# Patient Record
Sex: Male | Born: 1960 | Race: White | Hispanic: No | Marital: Single
Health system: Southern US, Community
[De-identification: ages and names within clinical notes are randomized; demographics above are authoritative.]

---

## 2013-12-04 ENCOUNTER — Emergency Department: Payer: Self-pay | Admitting: Emergency Medicine

## 2013-12-04 LAB — CBC WITH DIFFERENTIAL/PLATELET
BASOS PCT: 0.2 %
Basophil #: 0 10*3/uL (ref 0.0–0.1)
EOS PCT: 0.4 %
Eosinophil #: 0.1 10*3/uL (ref 0.0–0.7)
HCT: 41.3 % (ref 40.0–52.0)
HGB: 13.8 g/dL (ref 13.0–18.0)
LYMPHS ABS: 2.5 10*3/uL (ref 1.0–3.6)
Lymphocyte %: 14.9 %
MCH: 31.5 pg (ref 26.0–34.0)
MCHC: 33.4 g/dL (ref 32.0–36.0)
MCV: 94 fL (ref 80–100)
MONO ABS: 1.4 x10 3/mm — AB (ref 0.2–1.0)
Monocyte %: 8.4 %
NEUTROS ABS: 12.5 10*3/uL — AB (ref 1.4–6.5)
Neutrophil %: 76.1 %
Platelet: 143 10*3/uL — ABNORMAL LOW (ref 150–440)
RBC: 4.39 10*6/uL — ABNORMAL LOW (ref 4.40–5.90)
RDW: 14.4 % (ref 11.5–14.5)
WBC: 16.5 10*3/uL — AB (ref 3.8–10.6)

## 2013-12-04 LAB — COMPREHENSIVE METABOLIC PANEL
ALBUMIN: 3.5 g/dL (ref 3.4–5.0)
ALK PHOS: 87 U/L
Anion Gap: 9 (ref 7–16)
BUN: 12 mg/dL (ref 7–18)
Bilirubin,Total: 0.4 mg/dL (ref 0.2–1.0)
CALCIUM: 8.1 mg/dL — AB (ref 8.5–10.1)
Chloride: 107 mmol/L (ref 98–107)
Co2: 28 mmol/L (ref 21–32)
Creatinine: 1.03 mg/dL (ref 0.60–1.30)
EGFR (African American): >60
Glucose: 107 mg/dL — ABNORMAL HIGH (ref 65–99)
OSMOLALITY: 287 (ref 275–301)
Potassium: 4 mmol/L (ref 3.5–5.1)
SGOT(AST): 24 U/L (ref 15–37)
SGPT (ALT): 24 U/L
Sodium: 144 mmol/L (ref 136–145)
Total Protein: 7.3 g/dL (ref 6.4–8.2)

## 2013-12-04 LAB — LIPASE, BLOOD: Lipase: 69 U/L — ABNORMAL LOW (ref 73–393)

## 2013-12-04 LAB — ETHANOL: ETHANOL LVL: 210 mg/dL

## 2013-12-05 LAB — ETHANOL: Ethanol: <3 mg/dL

## 2013-12-05 LAB — URINALYSIS, COMPLETE
BACTERIA: NONE SEEN
BLOOD: NEGATIVE
Bilirubin,UR: NEGATIVE
Glucose,UR: NEGATIVE mg/dL (ref 0–75)
KETONE: NEGATIVE
LEUKOCYTE ESTERASE: NEGATIVE
Nitrite: NEGATIVE
Ph: 7 (ref 4.5–8.0)
Protein: NEGATIVE
SPECIFIC GRAVITY: 1.015 (ref 1.003–1.030)
Squamous Epithelial: NONE SEEN
WBC UR: <1 /HPF (ref 0–5)

## 2013-12-13 ENCOUNTER — Inpatient Hospital Stay: Payer: Self-pay | Admitting: Psychiatry

## 2013-12-13 LAB — CBC
HCT: 41.3 % (ref 40.0–52.0)
HGB: 14 g/dL (ref 13.0–18.0)
MCH: 31.9 pg (ref 26.0–34.0)
MCHC: 33.9 g/dL (ref 32.0–36.0)
MCV: 94 fL (ref 80–100)
Platelet: 258 10*3/uL (ref 150–440)
RBC: 4.4 10*6/uL (ref 4.40–5.90)
RDW: 14.5 % (ref 11.5–14.5)
WBC: 6.3 10*3/uL (ref 3.8–10.6)

## 2013-12-13 LAB — COMPREHENSIVE METABOLIC PANEL
ALT: 30 U/L
AST: 37 U/L (ref 15–37)
Albumin: 3.6 g/dL (ref 3.4–5.0)
Alkaline Phosphatase: 81 U/L
Anion Gap: 9 (ref 7–16)
BUN: 18 mg/dL (ref 7–18)
Bilirubin,Total: 0.3 mg/dL (ref 0.2–1.0)
CO2: 30 mmol/L (ref 21–32)
Calcium, Total: 8 mg/dL — ABNORMAL LOW (ref 8.5–10.1)
Chloride: 103 mmol/L (ref 98–107)
Creatinine: 1.2 mg/dL (ref 0.60–1.30)
EGFR (Non-African Amer.): >60
GLUCOSE: 126 mg/dL — AB (ref 65–99)
Osmolality: 287 (ref 275–301)
Potassium: 3.5 mmol/L (ref 3.5–5.1)
Sodium: 142 mmol/L (ref 136–145)
TOTAL PROTEIN: 7.2 g/dL (ref 6.4–8.2)

## 2013-12-13 LAB — ETHANOL
ETHANOL LVL: 308 mg/dL — AB
ETHANOL LVL: 191 mg/dL

## 2013-12-13 LAB — LIPASE, BLOOD: LIPASE: 89 U/L (ref 73–393)

## 2013-12-15 LAB — DRUG SCREEN, URINE
Amphetamines, Ur Screen: NEGATIVE (ref ?–1000)
Barbiturates, Ur Screen: NEGATIVE (ref ?–200)
Benzodiazepine, Ur Scrn: POSITIVE (ref ?–200)
Cannabinoid 50 Ng, Ur ~~LOC~~: NEGATIVE (ref ?–50)
Cocaine Metabolite,Ur ~~LOC~~: NEGATIVE (ref ?–300)
MDMA (ECSTASY) UR SCREEN: NEGATIVE (ref ?–500)
METHADONE, UR SCREEN: NEGATIVE (ref ?–300)
Opiate, Ur Screen: NEGATIVE (ref ?–300)
Phencyclidine (PCP) Ur S: NEGATIVE (ref ?–25)
Tricyclic, Ur Screen: NEGATIVE (ref ?–1000)

## 2013-12-15 LAB — URINALYSIS, COMPLETE
Bacteria: NONE SEEN
Bilirubin,UR: NEGATIVE
Blood: NEGATIVE
GLUCOSE, UR: NEGATIVE mg/dL (ref 0–75)
Ketone: NEGATIVE
Leukocyte Esterase: NEGATIVE
Nitrite: NEGATIVE
Ph: 6 (ref 4.5–8.0)
Protein: NEGATIVE
RBC,UR: <1 /HPF (ref 0–5)
Specific Gravity: 1.02 (ref 1.003–1.030)
Squamous Epithelial: NONE SEEN
WBC UR: 1 /HPF (ref 0–5)

## 2013-12-21 ENCOUNTER — Emergency Department: Payer: Self-pay | Admitting: Emergency Medicine

## 2013-12-21 LAB — CBC WITH DIFFERENTIAL/PLATELET
Basophil #: 0.1 10*3/uL (ref 0.0–0.1)
Basophil %: 0.6 %
EOS ABS: 0 10*3/uL (ref 0.0–0.7)
Eosinophil %: 0.3 %
HCT: 51.1 % (ref 40.0–52.0)
HGB: 17.5 g/dL (ref 13.0–18.0)
Lymphocyte #: 2.8 10*3/uL (ref 1.0–3.6)
Lymphocyte %: 34.4 %
MCH: 31.9 pg (ref 26.0–34.0)
MCHC: 34.3 g/dL (ref 32.0–36.0)
MCV: 93 fL (ref 80–100)
MONO ABS: 0.9 x10 3/mm (ref 0.2–1.0)
Monocyte %: 11.3 %
Neutrophil #: 4.4 10*3/uL (ref 1.4–6.5)
Neutrophil %: 53.4 %
Platelet: 274 10*3/uL (ref 150–440)
RBC: 5.5 10*6/uL (ref 4.40–5.90)
RDW: 14.7 % — AB (ref 11.5–14.5)
WBC: 8.2 10*3/uL (ref 3.8–10.6)

## 2013-12-21 LAB — COMPREHENSIVE METABOLIC PANEL
Albumin: 4.1 g/dL (ref 3.4–5.0)
Alkaline Phosphatase: 106 U/L
Anion Gap: 13 (ref 7–16)
BILIRUBIN TOTAL: 1.5 mg/dL — AB (ref 0.2–1.0)
BUN: 22 mg/dL — ABNORMAL HIGH (ref 7–18)
CO2: 23 mmol/L (ref 21–32)
CREATININE: 1.2 mg/dL (ref 0.60–1.30)
Calcium, Total: 8.6 mg/dL (ref 8.5–10.1)
Chloride: 101 mmol/L (ref 98–107)
Glucose: 158 mg/dL — ABNORMAL HIGH (ref 65–99)
OSMOLALITY: 280 (ref 275–301)
Potassium: 3.8 mmol/L (ref 3.5–5.1)
SGOT(AST): 244 U/L — ABNORMAL HIGH (ref 15–37)
SGPT (ALT): 185 U/L — ABNORMAL HIGH
SODIUM: 137 mmol/L (ref 136–145)
TOTAL PROTEIN: 8 g/dL (ref 6.4–8.2)

## 2013-12-21 LAB — URINALYSIS, COMPLETE
Bilirubin,UR: NEGATIVE
Blood: NEGATIVE
GLUCOSE, UR: NEGATIVE mg/dL (ref 0–75)
Leukocyte Esterase: NEGATIVE
Nitrite: NEGATIVE
Ph: 5 (ref 4.5–8.0)
Protein: 30
RBC,UR: 1 /HPF (ref 0–5)
SQUAMOUS EPITHELIAL: NONE SEEN
Specific Gravity: 1.029 (ref 1.003–1.030)
WBC UR: 1 /HPF (ref 0–5)

## 2013-12-21 LAB — LIPASE, BLOOD: Lipase: 778 U/L — ABNORMAL HIGH (ref 73–393)

## 2013-12-21 LAB — ETHANOL: Ethanol: 218 mg/dL

## 2013-12-22 ENCOUNTER — Inpatient Hospital Stay: Payer: Self-pay | Admitting: Internal Medicine

## 2013-12-22 LAB — CBC
HCT: 46.6 % (ref 40.0–52.0)
HGB: 15.4 g/dL (ref 13.0–18.0)
MCH: 31.2 pg (ref 26.0–34.0)
MCHC: 33.1 g/dL (ref 32.0–36.0)
MCV: 94 fL (ref 80–100)
PLATELETS: 249 10*3/uL (ref 150–440)
RBC: 4.94 10*6/uL (ref 4.40–5.90)
RDW: 14.3 % (ref 11.5–14.5)
WBC: 11.2 10*3/uL — AB (ref 3.8–10.6)

## 2013-12-22 LAB — COMPREHENSIVE METABOLIC PANEL
ALK PHOS: 117 U/L — AB
ALT: 177 U/L — AB
ANION GAP: 15 (ref 7–16)
Albumin: 3.8 g/dL (ref 3.4–5.0)
BUN: 21 mg/dL — AB (ref 7–18)
Bilirubin,Total: 3.7 mg/dL — ABNORMAL HIGH (ref 0.2–1.0)
Calcium, Total: 8.3 mg/dL — ABNORMAL LOW (ref 8.5–10.1)
Chloride: 98 mmol/L (ref 98–107)
Co2: 22 mmol/L (ref 21–32)
Creatinine: 1.25 mg/dL (ref 0.60–1.30)
EGFR (African American): >60
EGFR (Non-African Amer.): >60
GLUCOSE: 128 mg/dL — AB (ref 65–99)
Osmolality: 275 (ref 275–301)
Potassium: 4.2 mmol/L (ref 3.5–5.1)
SGOT(AST): 207 U/L — ABNORMAL HIGH (ref 15–37)
SODIUM: 135 mmol/L — AB (ref 136–145)
Total Protein: 7.2 g/dL (ref 6.4–8.2)

## 2013-12-22 LAB — PROTIME-INR
INR: 1
Prothrombin Time: 13.4 secs (ref 11.5–14.7)

## 2013-12-22 LAB — ETHANOL: ETHANOL LVL: 4 mg/dL

## 2013-12-22 LAB — LIPASE, BLOOD: Lipase: 3019 U/L — ABNORMAL HIGH (ref 73–393)

## 2013-12-23 LAB — COMPREHENSIVE METABOLIC PANEL
ALK PHOS: 93 U/L
ALT: 118 U/L — AB
Albumin: 2.7 g/dL — ABNORMAL LOW (ref 3.4–5.0)
Anion Gap: 9 (ref 7–16)
BILIRUBIN TOTAL: 3.7 mg/dL — AB (ref 0.2–1.0)
BUN: 14 mg/dL (ref 7–18)
Calcium, Total: 7.5 mg/dL — ABNORMAL LOW (ref 8.5–10.1)
Chloride: 104 mmol/L (ref 98–107)
Co2: 24 mmol/L (ref 21–32)
Creatinine: 1.16 mg/dL (ref 0.60–1.30)
EGFR (Non-African Amer.): >60
GLUCOSE: 109 mg/dL — AB (ref 65–99)
Osmolality: 275 (ref 275–301)
POTASSIUM: 3.5 mmol/L (ref 3.5–5.1)
SGOT(AST): 119 U/L — ABNORMAL HIGH (ref 15–37)
Sodium: 137 mmol/L (ref 136–145)
TOTAL PROTEIN: 5.7 g/dL — AB (ref 6.4–8.2)

## 2013-12-23 LAB — CBC WITH DIFFERENTIAL/PLATELET
BASOS PCT: 0.5 %
Basophil #: 0 10*3/uL (ref 0.0–0.1)
Eosinophil #: 0 10*3/uL (ref 0.0–0.7)
Eosinophil %: 0.6 %
HCT: 40.8 % (ref 40.0–52.0)
HGB: 13.8 g/dL (ref 13.0–18.0)
Lymphocyte #: 1.8 10*3/uL (ref 1.0–3.6)
Lymphocyte %: 22.8 %
MCH: 32.1 pg (ref 26.0–34.0)
MCHC: 33.8 g/dL (ref 32.0–36.0)
MCV: 95 fL (ref 80–100)
Monocyte #: 0.9 x10 3/mm (ref 0.2–1.0)
Monocyte %: 11 %
Neutrophil #: 5.1 10*3/uL (ref 1.4–6.5)
Neutrophil %: 65.1 %
PLATELETS: 117 10*3/uL — AB (ref 150–440)
RBC: 4.31 10*6/uL — ABNORMAL LOW (ref 4.40–5.90)
RDW: 14.3 % (ref 11.5–14.5)
WBC: 7.8 10*3/uL (ref 3.8–10.6)

## 2013-12-23 LAB — URINALYSIS, COMPLETE
BACTERIA: NONE SEEN
BILIRUBIN, UR: NEGATIVE
GLUCOSE, UR: NEGATIVE mg/dL (ref 0–75)
Leukocyte Esterase: NEGATIVE
Nitrite: NEGATIVE
Ph: 6 (ref 4.5–8.0)
Protein: NEGATIVE
RBC,UR: 1 /HPF (ref 0–5)
SPECIFIC GRAVITY: 1.025 (ref 1.003–1.030)
Squamous Epithelial: NONE SEEN
WBC UR: 1 /HPF (ref 0–5)

## 2013-12-23 LAB — LIPASE, BLOOD: LIPASE: 1486 U/L — AB (ref 73–393)

## 2013-12-24 LAB — COMPREHENSIVE METABOLIC PANEL
ALT: 82 U/L — AB
Albumin: 2.5 g/dL — ABNORMAL LOW (ref 3.4–5.0)
Alkaline Phosphatase: 92 U/L
Anion Gap: 7 (ref 7–16)
BUN: 6 mg/dL — AB (ref 7–18)
Bilirubin,Total: 2.3 mg/dL — ABNORMAL HIGH (ref 0.2–1.0)
CREATININE: 0.91 mg/dL (ref 0.60–1.30)
Calcium, Total: 7.3 mg/dL — ABNORMAL LOW (ref 8.5–10.1)
Chloride: 102 mmol/L (ref 98–107)
Co2: 25 mmol/L (ref 21–32)
EGFR (African American): >60
Glucose: 81 mg/dL (ref 65–99)
OSMOLALITY: 265 (ref 275–301)
Potassium: 3.3 mmol/L — ABNORMAL LOW (ref 3.5–5.1)
SGOT(AST): 76 U/L — ABNORMAL HIGH (ref 15–37)
Sodium: 134 mmol/L — ABNORMAL LOW (ref 136–145)
Total Protein: 5.7 g/dL — ABNORMAL LOW (ref 6.4–8.2)

## 2013-12-24 LAB — CBC WITH DIFFERENTIAL/PLATELET
BASOS PCT: 0.7 %
Basophil #: 0.1 10*3/uL (ref 0.0–0.1)
EOS PCT: 1.5 %
Eosinophil #: 0.1 10*3/uL (ref 0.0–0.7)
HCT: 36.9 % — ABNORMAL LOW (ref 40.0–52.0)
HGB: 12.5 g/dL — ABNORMAL LOW (ref 13.0–18.0)
LYMPHS ABS: 1.4 10*3/uL (ref 1.0–3.6)
Lymphocyte %: 18.9 %
MCH: 31.8 pg (ref 26.0–34.0)
MCHC: 34 g/dL (ref 32.0–36.0)
MCV: 94 fL (ref 80–100)
MONOS PCT: 9.2 %
Monocyte #: 0.7 x10 3/mm (ref 0.2–1.0)
NEUTROS ABS: 5.3 10*3/uL (ref 1.4–6.5)
Neutrophil %: 69.7 %
Platelet: 75 10*3/uL — ABNORMAL LOW (ref 150–440)
RBC: 3.94 10*6/uL — AB (ref 4.40–5.90)
RDW: 14.1 % (ref 11.5–14.5)
WBC: 7.6 10*3/uL (ref 3.8–10.6)

## 2013-12-24 LAB — LIPASE, BLOOD: LIPASE: 442 U/L — AB (ref 73–393)

## 2013-12-25 LAB — COMPREHENSIVE METABOLIC PANEL
ALBUMIN: 2.5 g/dL — AB (ref 3.4–5.0)
ALK PHOS: 79 U/L
ALT: 60 U/L
ANION GAP: 6 — AB (ref 7–16)
BILIRUBIN TOTAL: 1.7 mg/dL — AB (ref 0.2–1.0)
BUN: 5 mg/dL — ABNORMAL LOW (ref 7–18)
CALCIUM: 7.6 mg/dL — AB (ref 8.5–10.1)
CHLORIDE: 103 mmol/L (ref 98–107)
Co2: 24 mmol/L (ref 21–32)
Creatinine: 0.92 mg/dL (ref 0.60–1.30)
EGFR (Non-African Amer.): >60
Glucose: 93 mg/dL (ref 65–99)
Osmolality: 263 (ref 275–301)
Potassium: 3.2 mmol/L — ABNORMAL LOW (ref 3.5–5.1)
SGOT(AST): 45 U/L — ABNORMAL HIGH (ref 15–37)
Sodium: 133 mmol/L — ABNORMAL LOW (ref 136–145)
TOTAL PROTEIN: 5.8 g/dL — AB (ref 6.4–8.2)

## 2013-12-25 LAB — LIPASE, BLOOD: LIPASE: 333 U/L (ref 73–393)

## 2014-05-06 NOTE — Consult Note (Signed)
PATIENT NAME:  Connor Rice, Connor Rice MR#:  [id number] DATE OF BIRTH:  04/11/1960  DATE OF CONSULTATION:  12/23/2013  REFERRING PHYSICIAN:   CONSULTING PHYSICIAN:  Audery AmelJohn T. Jahzion Brogden, MD  IDENTIFYING INFORMATION AND REASON FOR CONSULT: This is a 54 year old man with a history of alcohol abuse and depression in the past who is currently in the hospital with probable pancreatitis. Consultation is for depression.   HISTORY OF PRESENT ILLNESS: Information obtained from the patient and the chart. The patient was in the hospital recently and was discharged on December 4. He returned to the hospital just about 6 days later with acute, severe abdominal pain. The patient reports that after discharge from the hospital, he pretty much immediately went back to drinking about 1/2 gallon of liquor a day. Currently he states that his mood is feeling down and depressed. He is not sleeping well at night. He says he is having crying spells, not infrequently. Denies any current suicidal ideation. He is having visual hallucinations. Says he sees people walking around in his room and that the ceiling of his room seems to be waving strangely. Denies auditory hallucinations. Does not appear to be having other acute psychosis. He is not feeling hopeless about his situation.   PAST PSYCHIATRIC HISTORY: The patient does have a past history of depression and a past history of a serious suicide attempt, but it was decades ago in 1981. He has been on several antidepressants in the past including Celexa and Prozac, but has rarely stayed compliant with them. No known history of bipolar disorder. History of long-standing alcohol abuse with multiple physical problems as a result.   PAST MEDICAL HISTORY: Has a history of pancreatitis and diverticulitis, irritable bowel syndrome, consequences of alcohol abuse.   SOCIAL HISTORY: The patient is no longer employed and he is living off his retirement income. He and a woman he identifies as his  girlfriend, who is also an alcoholic, are living in motels pretty much hand-to-mouth and drinking heavily. Estranged from most of his family.   FAMILY HISTORY: Reports that his father and brother had severe mental health problems.   REVIEW OF SYSTEMS: Currently says he is having some visual hallucinations. Mood is depressed. Pain in his abdomen. Not able to take anything by mouth right now. Not having acute suicidal ideation.   MENTAL STATUS EXAMINATION: Disheveled, acutely ill gentleman looks his stated age, cooperative with the interview. Eye contact decreased. He mostly stares intently at the ceiling. Speech decreased in amount and quiet. Affect is flat. Mood is stated as bad. Thoughts are decreased in amount, slow and interrupted, but not bizarre or delusional. Mostly lucid. Positive visual hallucinations. No auditory hallucinations. No acute suicidal or homicidal ideation. Can recall 3/3 objects immediately, only 1/3 at 3 minutes. He is alert and oriented, but got the date wrong.   LABORATORY RESULTS: Lipase is still running up around close to 1500. Has abnormalities of his AST and ALT as well as his protein and albumin. Low platelet count 117,000.   VITAL SIGNS: Blood pressure 153/80, respirations 18, pulse 87, temperature 99.2.   CURRENT MEDICATIONS: Pepcid 20 mg IV, heparin injection, Ativan IV p.r.n. by protocol, morphine IV by protocol, Zofran IV p.r.n.   ASSESSMENT: A 54 year old man with alcohol abuse and a past history of depression, currently major problem is alcohol withdrawal. He is having visual hallucinations, but he is not presenting otherwise as being delirious and his insight about them is good. I think this is probably not delirium tremens,  so much as it could be side effects of the morphine that he is getting. He tells me that morphine has caused visual hallucinations in the past. Nevertheless, he is still having significant alcohol withdrawal and needs to stay on the  detoxification taper. The patient has depressive symptoms, but also is in pain and has been drinking. Difficult to assess the appropriateness of antidepressant treatment under these circumstances and antidepressants could not be administered anyway due to his being n.p.o.   TREATMENT PLAN: I am going give him a 1-time dose tonight of Valium 10 mg IV to help him sleep. No indication to start antidepressants right now. Continue detox protocol as ordered. If withdrawal symptoms get much worse or delirium presents, would rapidly escalate the amount of Ativan that he is getting. I will follow up tomorrow and we can reassess depression as he heals up.   DIAGNOSIS, PRINCIPAL AND PRIMARY:  AXIS I: Alcohol withdrawal.   SECONDARY DIAGNOSES: AXIS I: 1.  Depression, not otherwise specified. 2.  Alcohol abuse.  AXIS II: Deferred.  AXIS III: Pancreatitis.   ____________________________ Audery Amel, MD jtc:sw D: 12/23/2013 18:32:24 ET T: 12/23/2013 18:42:24 ET JOB#: [id number]  cc: Audery Amel, MD, <Dictator> Audery Amel MD ELECTRONICALLY SIGNED 12/25/2013 11:24

## 2014-05-06 NOTE — Consult Note (Signed)
PATIENT NAME:  Connor Rice, Connor Rice MR#:  [id number] DATE OF BIRTH:  1960-11-30  DATE OF CONSULTATION:  12/13/2013  CONSULTING PHYSICIAN:  Audery AmelJohn T. Tristyn Pharris, MD  IDENTIFYING INFORMATION AND REASON FOR CONSULT:  A 54 year old man with a history of alcohol abuse and depression presents to the hospital.   CHIEF COMPLAINT: "I'm depressed."   HISTORY OF PRESENT ILLNESS: Information obtained from the patient and the chart. The patient came voluntarily to the hospital stating that he was feeling depressed. He says that his mood is down.  Feels hopeless.  Feels like his life is over. Energy level low.  On top of that, he is drinking heavily.  He presented with a blood alcohol level of 308 this morning.  He just got out of [facility] a couple of days ago and immediately started drinking again.  Estimates his drinking as being nearly 1/2 gallon a day at times.  He is not currently taking any psychiatric medicine.  He says he has been depressed at least since January when he lost his last job. He has been in and out of psychiatric hospitals in detoxes ever since but has not been able to maintain sobriety for any length of time.  He is traveling around with his girlfriend who also is drinking heavily.   PAST PSYCHIATRIC HISTORY: Says that he tried to kill himself in [id number].  Currently he has passive suicidal thoughts with no intention or plan.  He has had multiple psychiatric hospitalizations for detoxification including at [facility].  He has been to our ER before as well. He has been prescribed antidepressants including Celexa and Prozac, but has not remained compliant with them; does not know whether they would work.   PAST MEDICAL HISTORY: Says that he has a history of pancreatitis, diverticulitis, irritable bowel syndrome and gastritis. Not on any current medicines.   ALLERGIES: LATEX.   SOCIAL HISTORY: Not employed.  Living off his retirement savings.  He and his girlfriend are both alcoholics and they have been traveling  around staying in motels because no one will let them rent a place from them.  Estranged from most of the rest of his family. He feels like he has no resources.   FAMILY HISTORY: Father and brother both had mental health problems by his report.   REVIEW OF SYSTEMS:  Depressed mood, poor sleep, hopelessness, passive suicidal thoughts. No hallucinations.   MENTAL STATUS EXAMINATION: A somewhat disheveled gentleman who looks his stated age. Passively cooperative with the interview. Eye contact poor. Psychomotor activity very slow and sluggish.  Speech is slow with thought blocking.  Affect flat and dysphoric.  Mood is stated as depressed. Thoughts are slightly disorganized, but not bizarre.  Denies any current hallucinations. Denies suicidal or homicidal intent or plan although he says he has had passive wishes that he would die.  He can recall 2/3 object at 3 minutes. He is alert and oriented to his place and to the year, does not know the correct date or month. Baseline intelligence presumably normal.  Fund of knowledge normal.   LABORATORY RESULTS: Alcohol level this morning was 308. Glucose 126, calcium low at 8.0, lipase 89. CBC normal.   VITAL SIGNS: His blood pressure is 125/98, respirations 17, pulse 65, temperature 97.5.   ASSESSMENT: This is a 54 year old man with alcohol dependence and depression, possibly major depression versus related to his alcohol abuse, who gives a history of having had seizures in the past possibly DTs. Says he is still depressed with passive  suicidal ideation and a feeling of hopelessness. The patient needs hospitalization, at least for safe detoxification and stabilization.   TREATMENT PLAN: Admit to psychiatry. Full detox protocol in place. Seizure and fall precautions in place.  Defer any decisions about antidepressants to primary psychiatric team. I will give him some more Librium right now to help make sure that he is going to detox safely.   DIAGNOSIS, PRINCIPAL  AND PRIMARY:  AXIS I: Alcohol abuse, severe.   SECONDARY DIAGNOSES:  AXIS I: Depression, not otherwise specified.  AXIS II: Deferred.  AXIS III: History of gastritis and pancreatitis.     ____________________________ Audery Amel, MD jtc:DT D: 12/13/2013 17:00:53 ET T: 12/13/2013 17:15:31 ET JOB#: [id number]  cc: Audery Amel, MD, <Dictator> Audery Amel MD ELECTRONICALLY SIGNED 12/23/2013 19:46

## 2014-05-06 NOTE — Consult Note (Signed)
Brief Consult Note: Diagnosis: alcohol abuse.   Patient was seen by consultant.   Consult note dictated.   Orders entered.   Comments: Psychiatry: Patient seen. Chart reviewed. Orders entered and note done. Alcohol withdrawl. Qhs valium iv tonight for sleep. Continue CIWA.  Electronic Signatures: Audery Amellapacs, Kevina Piloto T (MD)  (Signed 11-Dec-15 18:25)  Authored: Brief Consult Note   Last Updated: 11-Dec-15 18:25 by Audery Amellapacs, Campbell Kray T (MD)

## 2014-05-06 NOTE — Consult Note (Signed)
Psychiatry: Follow-up note for this 54 year old man going through extended alcohol withdrawal also history of depression.  Patient states that he continues to feel pretty rough.  Still feels depressed.  Doesn't report active suicidal thinking.  He is still reporting that he is having disturbing dreams and seems to be having intermittent delirious hallucinations. reports his pain level is improved.  He is starting to take clear liquids today and so far was tolerating it.  No other pain complaints. was awake and alert and interactive.  Eye contact only intermittent.  Psychomotor activity still slows.  Speech is quiet but understandable.  Affect flat.  Thoughts were notable for him still having some odd distractible believes that probably rise out of some delirium.  He asked me in all seriousness if we had both just gotten back from a trip together.  Seem to be frequently puzzled.  States his mood is depressed but denies suicidality.  Has a hard time articulating specific plans. discussed with hospitalist.  Continue detox monitoring and protocol.  Add citalopram 20 mg a day which has been a effective antidepressant for him in the past.  Side effects discussed.  Patient is agreeable to the plan.  I will continue to follow up as needed.  Diagnosis alcohol dependence with withdrawal and major depression  Electronic Signatures: Inez Rosato, Jackquline DenmarkJohn T (MD)  (Signed on 12-Dec-15 12:36)  Authored  Last Updated: 12-Dec-15 12:36 by Audery Amellapacs, Arilla Hice T (MD)

## 2014-05-06 NOTE — Consult Note (Signed)
Psychiatry: Follow-up patient with depression and alcohol abuse.  States today he is feeling better.  Still feels nervous but he has a focus for his anxiety.  Not feeling depressed or hopeless.  Physically feeling much better.  Able to eat solid food without getting sick to his stomach.  On mental status he is alert and oriented.  No longer having any hallucinations.  No evidence of delusions.  Delirium appears to have cleared.  Denies suicidality. has negative review of systems psychiatrically no depression no suicidal ideation no hallucinations. citalopram.  Referred to outpatient substance abuse treatment and local mental health treatment at discharge.  Electronic Signatures: Eswin Worrell, Jackquline DenmarkJohn T (MD)  (Signed on 13-Dec-15 13:12)  Authored  Last Updated: 13-Dec-15 13:12 by Audery Amellapacs, Aitan Rossbach T (MD)

## 2014-05-10 NOTE — Discharge Summary (Signed)
PATIENT NAME:  Connor Rice, Connor Rice MR#:  [id number] DATE OF BIRTH:  1960-06-27  DATE OF ADMISSION:  12/22/2013 DATE OF DISCHARGE:  12/25/2013  ADMITTING PHYSICIAN: Enid Baasadhika Cailin Gebel, MD  DISCHARGING PHYSICIAN: Enid Baasadhika Courtany Mcmurphy, MD   PRIMARY CARE PHYSICIAN: None.   CONSULTATIONS IN THE HOSPITAL: Psychiatric consultation by Audery AmelJohn T. Clapacs, MD.   DISCHARGE DIAGNOSES:  1.  Acute alcoholic pancreatitis.  2.  Alcohol dependence.  3.  Alcohol withdrawal.  4.  Hypokalemia.  5.  Depression.  6.  Gastroesophageal reflex disease.   DISCHARGE HOME MEDICATIONS:  1.  Protonix 40 mg p.o. b.i.d.  2.  Oxycodone 5 mg q. 6 hours p.r.n. for pain.  3.  Celexa 20 mg p.o. daily.  4.  Promethazine 12.5 mg orally q. 8 hours p.r.n. for nausea or vomiting.   DISCHARGE DIET: Low-fat diet.   DISCHARGE ACTIVITY: As tolerated.   FOLLOWUP INSTRUCTIONS:  1.  PCP follow up in 1 to 2 weeks.  2.  Alcohol rehabilitation outpatient.  RECOMMENDATIONS: Suggested and advised to stop alcohol.   LABORATORIES AND IMAGING STUDIES PRIOR TO DISCHARGE: Sodium 133, potassium 3.2, chloride 103, bicarbonate 24, BUN 5, creatinine 0.92, glucose 93 and calcium of 7.6.   ALT 60, AST 45, alkaline phosphatase 79, total bilirubin 1.7, albumin of 2.5. Lipase is 333.   WBC 7.6, hemoglobin 12.5, hematocrit 36.9, platelet count is 75,000. Urinalysis negative for any infection.   Ultrasound of the abdomen on admission showing gallbladder sludge. No cholelithiasis. No evidence of cholecystitis, hepatic steatosis.   Lipase on admission was 3000.   BRIEF HOSPITAL COURSE: Connor Rice is a 54 year old Caucasian male with past medical history significant for alcohol abuse, [facility]. The patient was admitted for abdominal pain, nausea, vomiting and noted to have acute alcoholic pancreatitis.  1.  Acute alcoholic pancreatitis. Lipase was 3000 when he got admitted, started on  fluids, kept n.p.o., pain medications and nausea medications were given. Lipase has normalized. His diet was gradually advanced, and he is able to tolerate a regular diet at this point. He has been given a prescription for 4 days' worth of pain medicines and also Phenergan for his nausea as needed.  2.  Alcohol dependence and withdrawals. Monitor and detoxification while in the hospital. Seen by psychiatry as well. Recommended outpatient alcohol rehabilitation. The patient did not score on the CIWA at the end, he was voluntarily asking for Ativan for anxiety issues. He has not been confused; alert and oriented at the time of discharge.  3.  Depression. Treated for depression in the hospital at the end of November 2015, discharged, not on any antidepressants. Seen by psychiatrist, Dr. Toni Amendlapacs. The patient is not suicidal or homicidal at this time, being discharged on Celexa for his depression. Not sure about the compliance. He was suggested to follow up with the community psychiatrist.  4.  Gastroesophageal reflex disease. On Protonix at this time.   His course has been otherwise uneventful in the hospital.   DISCHARGE CONDITION: Stable.   DISCHARGE DISPOSITION: Home.   TIME SPENT ON DISCHARGE: Forty minutes.    ____________________________ Enid Baasadhika Stephonie Wilcoxen, MD rk:TT D: 12/25/2013 12:20:02 ET T: 12/25/2013 17:30:25 ET JOB#: [id number]  cc: Enid Baasadhika Jontavius Rabalais, MD, <Dictator> Enid BaasADHIKA Sharrell Krawiec MD ELECTRONICALLY SIGNED 01/17/2014 14:36

## 2014-05-10 NOTE — H&P (Signed)
PATIENT NAME:  Connor Rice, Connor Rice MR#:  [id number] DATE OF BIRTH:  November 01, 1960  DATE OF ADMISSION:  12/22/2013   ADMITTING PHYSICIAN:  Enid Baas, MD    PRIMARY CARE PHYSICIAN: None.   CHIEF COMPLAINT: Abdominal pain, nausea, and vomiting.   HISTORY OF PRESENT ILLNESS: Connor Rice is a 54 year old, Caucasian male with past medical history significant for significant alcohol abuse and depression. Recent admission to Behavioral Medicine unit last week for depression, was discharged, comes back with abdominal pain, nausea and vomiting for 3 days.  The patient drinks about at least a pint every day of vodka and sometimes even drinking as much as a half-gallon according to him.  He was sent in for detoxification and depression treatment and was discharged on 12/16/2013 from Behavioral Medicine unit.  The patient states that his home life has been under a lot of social stress recently and has started drinking right back after discharge. He used to be a heavy user of cocaine in the past, but none recently. He has had a history of pancreatitis in the past before.  He has been having abdominal pain, nausea and vomiting, denies any diarrhea, unable to keep anything, down, getting worse today, so presents today.  A repeated lipase is elevated over 3000 and he is being admitted for acute alcoholic pancreatitis.    PAST MEDICAL HISTORY:   1.  Severe alcohol abuse.  2.  History of alcohol withdrawal. 3.  Depression.  4.  Hypertension.  5.  Diverticulosis and an episode of diverticulitis.  6.  Prior episodes of pancreatitis.  7.  Gastritis.  8.  History of cocaine abuse.   PAST SURGICAL HISTORY:  Inguinal hernia surgery and lazy left eye surgery when he was young.   ALLERGIES TO MEDICATIONS: LATEX.   CURRENT HOME MEDICATIONS: The patient was discharged on propranolol 60 mg p.o. daily and Protonix 40 mg p.o. twice a day. However, he has not been taking any of his medications.  Of note, he is not being  discharged on any antidepressants.   SOCIAL HISTORY: Homeless and living between hotels and friends, not employed at this time, living off of his savings.  He is not smoking anymore.  Quit smoking several years ago.  He used to do heavy cocaine in the past, but quit doing it at this time.  Continues to drink alcohol at least a fifth every day.   FAMILY HISTORY: Both parents had diverticulosis as much as he can think of.   REVIEW OF SYSTEMS:  CONSTITUTIONAL: No fever, positive for weakness. No fatigue, weight loss or weight gain.  EYES: No blurred vision, double vision, inflammation or glaucoma.  ENT: No tinnitus, ear pain, hearing loss, epistaxis or discharge.  RESPIRATORY: No cough, wheeze, hemoptysis or chronic obstructive pulmonary disease.  CARDIOVASCULAR: No chest pain, orthopnea, edema, arrhythmias, palpitations, or syncope.  GASTROINTESTINAL: Positive for nausea, vomiting, no diarrhea, positive for abdominal pain. No jaundice or melena   GENITOURINARY: No dysuria, hematuria, renal calculus, frequency, or incontinence.  ENDOCRINE: No polyuria, nocturia, thyroid problems, heat or cold intolerance.  HEMATOLOGY: No anemia, easy bruising or bleeding.  SKIN: No acne, rash or lesions.  MUSCULOSKELETAL: No neck, back, shoulder pain, arthritis or gout.  NEUROLOGIC: No numbness, weakness, CVA, transient ischemic attack or seizures.  PSYCHOLOGICAL: No anxiety, insomnia or depression.   PHYSICAL EXAMINATION:  VITAL SIGNS: Temperature 97.8 degrees Fahrenheit, pulse 114, respirations 18, blood pressure 116/83, pulse ox 96% on room air.  GENERAL: Well-built, well-nourished male, lying in bed,  in acute distress.  HEENT: Normocephalic, atraumatic. Pupils equal, round, reacting to light. Anicteric sclerae. Extraocular movements intact. Oropharynx clear without erythema, mass or exudates.  NECK: Supple. No thyromegaly, JVD or carotid bruits. No lymphadenopathy.  LUNGS: Moving air bilaterally. No wheeze  or crackles. No use of accessory muscles for breathing.  CARDIOVASCULAR: S1, S2, regular rate and rhythm. No murmurs, rubs, or gallops.  ABDOMEN: Soft, but diffuse tenderness noted. No hepatosplenomegaly. Normal bowel sounds. Voluntary guarding noted.  EXTREMITIES: No pedal edema. No clubbing or cyanosis, 2+ dorsalis pedis pulses palpable bilaterally.  SKIN: No acne, rash or lesions.  LYMPHATICS: No cervical or inguinal lymphadenopathy.  NEUROLOGIC: Cranial nerves intact. No focal motor deficits.  PSYCHOLOGICAL: The patient is awake, alert, oriented x 3. Not actively depressed with no suicidal or homicidal intentions at this time.    LABORATORY DATA: WBC 11.2, hemoglobin 15.4, hematocrit 46.6, platelet count 249,000. Sodium 135, potassium 4.2, chloride 98, bicarbonate 22, BUN 21, creatinine 1.25, glucose 128, and calcium of 8.3.   ALT 177, AST 207, alkaline phosphatase 117, total bilirubin is 8.7, albumin of 3.8. Alcohol level is negative.  Lipase is 3019.  Abdominal ultrasound showing increased liver echogenicity. possible hepatic steatosis and gallbladder sludge. No evidence of cholelithiasis or acute cholecystitis noted.   ASSESSMENT AND PLAN: This is a 54 year old man with known history of alcohol abuse and depression who was recently discharged from the Behavioral medicine unit, comes with acute alcoholic pancreatitis.  1.  Acute alcoholic pancreatitis, elevated lipase, keep him n.p.o., IV fluids, cause secondary to alcohol abuse. Check lipase in a.m.  2.  Alcoholic hepatitis with elevated liver function test, ultrasound with no obstruction, fatty liver noted.  Liver function tests are elevated during the last admission as well.    3.  Alcohol abuse, history of withdrawal, so we will placed on CIWA protocol.    4.  Depression with recent discharge not on any antidepressants, not actively homicidal or suicidal.  We will see if psychiatric can see him to see if he needs any antidepressant.  5.   Gastritis.  Start IV Pepcid b.i.d. while he is n.p.o.   CODE STATUS: FULL CODE.   TIME SPENT ON ADMISSION: 50 minutes.    ____________________________ Enid Baasadhika Heaton Sarin, MD rk:DT D: 12/22/2013 17:08:33 ET T: 12/22/2013 19:05:21 ET JOB#: [id number]  cc: Enid Baasadhika Jiro Kiester, MD, <Dictator> Enid BaasADHIKA Jessie Cowher MD ELECTRONICALLY SIGNED 01/17/2014 13:51

## 2014-05-23 NOTE — H&P (Signed)
PATIENT NAME:  Connor Rice, Connor Rice [id number] OF BIRTH:  05-03-1960 OF ADMISSION: 12/13/13 INFORMATION:  A 54 year old man with a history of alcohol abuse and depression presents to the hospital.  COMPLAINT: "I'm depressed."  OF PRESENT ILLNESS: Information obtained from the patient and the chart. The patient came voluntarily to the hospital stating that he was feeling depressed. He says that his mood is down.  Feels hopeless.  Feels like his life is over. Energy level low.  On top of that, he is drinking heavily.  He presented with a blood alcohol level of 308 this morning.  He just got out of [facility] a couple of days ago and immediately started drinking again.  Estimates his drinking as being nearly 1/2 gallon a day at times.  He is not currently taking any psychiatric medicine.  He says he has been depressed at least since January when he lost his last job. He has been in and out of psychiatric hospitals in detoxes ever since but has not been able to maintain sobriety for any length of time.  He is traveling around with his girlfriend who also is drinking heavily.  substance abuse: heavy use of cocaine in the 80?s and experimented with many other drugs.  He had his first visit to rehab in 6091 for addiction to alcohol and cocaine. was sober from alcohol for 7 years back in the 90?s. He was at that time attending AA. not smoke cigarettes.  he denies SI, HI or A/V H. PSYCHIATRIC HISTORY: Says that he tried to kill himself in 1981 by overdosing on ?horse tranquilizers" and alcohol, states he was in a coma for 2 days..  Currently he has passive suicidal thoughts with no intention or plan.  He has had multiple psychiatric hospitalizations for detoxification [facility]. [facility].  He has been to our ER before as well. He has been in rehab a multitude of times. Pt was just d/c from [facility] a week ago. He has been prescribed with Celexa, Prozac and buspar in the past but has not  remained compliant with them; does not know whether they would work.  MEDICAL HISTORY: Says that he has a history of pancreatitis, diverticulitis, irritable bowel syndrome, gastritis, HTN. Takes inderall unknown dose.   ALLERGIES: LATEX.  HISTORY: Homeless living in hotels and with friends. 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  Living off his retirement savings.  He and his girlfriend are both alcoholics and they have been traveling around staying in motels because no one will let them rent a place from them.  Girlfriend has been hospitalized at our hospital for complications of cirrhosis. Estranged from most of the rest of his family. He feels like he has no resources.  HISTORY: Father and brother both are alcoholics. Denies h/o suicide in his family.  OF SYSTEMS:  C/o nausea and vomiting.  The rest of the 10 review of systems is negative.  STATUS EXAMINATION: A somewhat disheveled gentleman who looks his stated age. Cooperative with the interview. Eye contact good. Psychomotor activity: retarded. Speech: regular tone and volume, increased rate.  Affect: restricted.  Mood is anxious. Thought process: circumstantial Thought content: -SI, HI or A/VH. He can recall 2/3 object at 3 minutes. He is alert and oriented to his place and to the year, does not know the correct date or month. Baseline intelligence presumably normal.  Fund of knowledge normal.  RESULTS: Alcohol  level this morning was 308. Glucose 126, calcium low at 8.0, lipase 89. CBC normal.  SIGNS: His blood pressure is 125/98, respirations 17, pulse 65, temperature 97.5.  This is a 54 year old man with severe alcohol dependence.  Admitted for alcohol detox. use disorder severewithdrawalinduced depressive disorderuse disorder severe in full resmissiongastritisalcoholic pancreatitisno insurance, unemployed, girlfriend?s health issues  Alcohol withdrawal:CIWA q 8 hVitals q 8 hAtivan 2 mg poq 8 h prn if  CIWA >15start Librium 50 mg q 8 h induced depressive disordernot restart antidepressants at this time as symptoms are likely secondary to alcohol gastritisstart pantoprazole bid start Phenergan 25 mg q 6 h prn start vistaril 50 mg po qhs  start inderall planning: will recommend long term programs of alcohol and drug addiction.   Electronic Signatures: Jimmy FootmanHernandez-Gonzalez, Patriciann Becht (MD)  (Signed on 02-Dec-15 13:33)  Authored  Last Updated: 02-Dec-15 13:33 by Jimmy FootmanHernandez-Gonzalez, Vera Wishart (MD)

## 2016-06-05 IMAGING — CT CT ABD-PELV W/ CM
2 of 5 series · 14 of 46 positions shown, 16 images · IV contrast (agent unspecified)
Comparison: None available.

CLINICAL DATA: Initial evaluation for abdominal pain, epigastric
pain with nausea and vomiting. Rectal bleeding. History of
pancreatitis. History of primary repair.

EXAM:
CT ABDOMEN AND PELVIS WITH CONTRAST
TECHNIQUE: Multidetector CT imaging of the abdomen and pelvis was performed
using the standard protocol following bolus administration of
intravenous contrast.
CONTRAST:  100 cc of 1sovue-RTT.

[Series 2: routine abd pel with · axial · 0.77mm/px · z∈[-534,-54]mm · 11 of 108 slices shown, 13 images]
[im 6/108  soft-tissue]
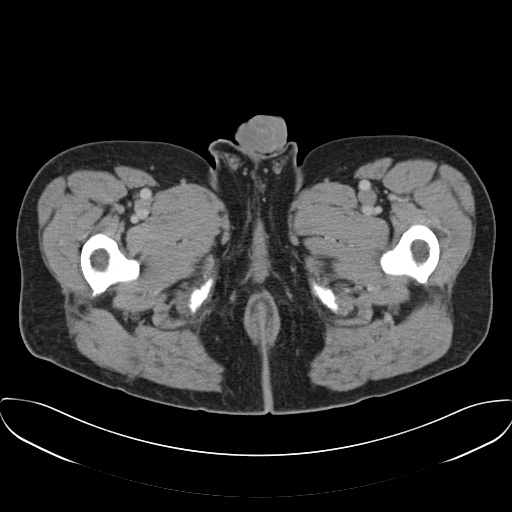
[im 6/108  bone]
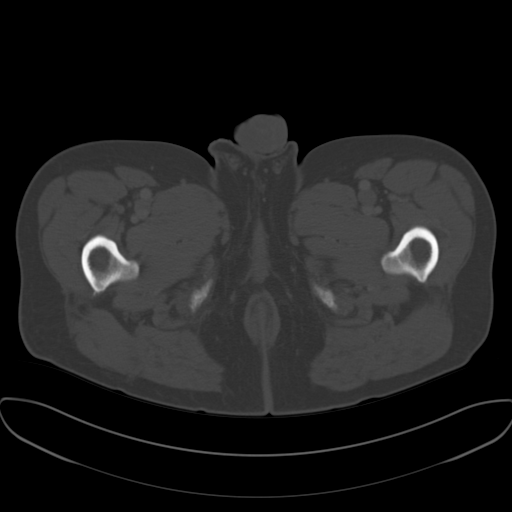
[im 17/108  soft-tissue]
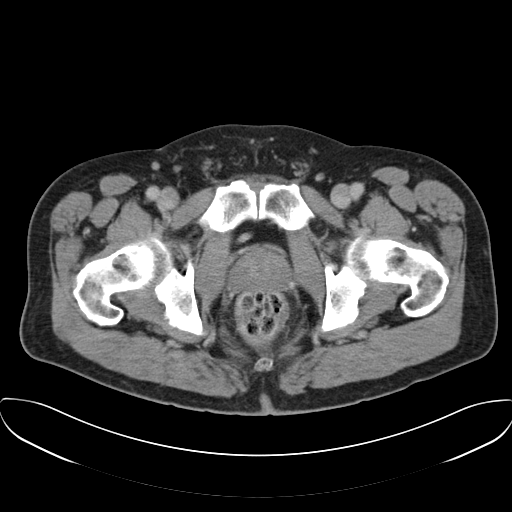
[im 29/108  soft-tissue]
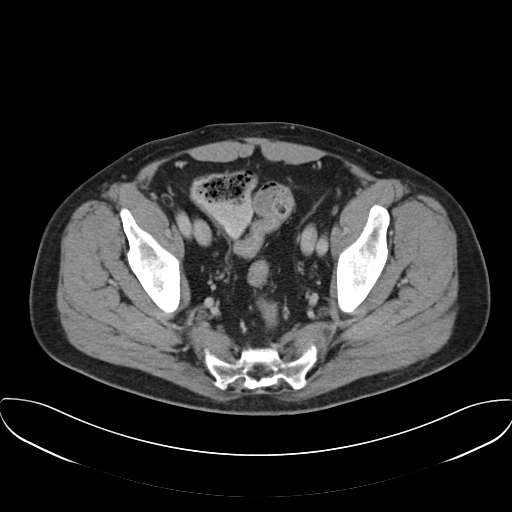
[im 34/108  soft-tissue]
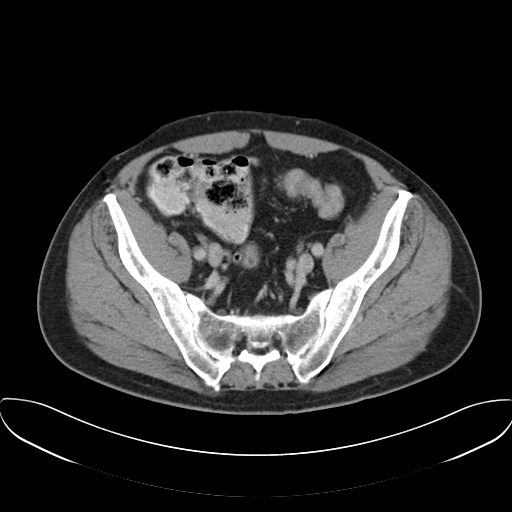
[im 46/108  soft-tissue]
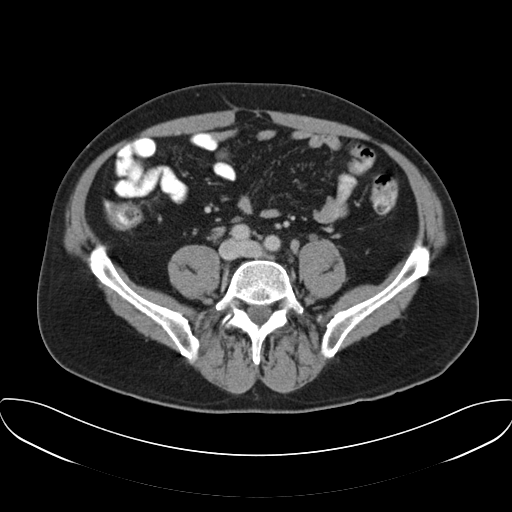
[im 57/108  soft-tissue]
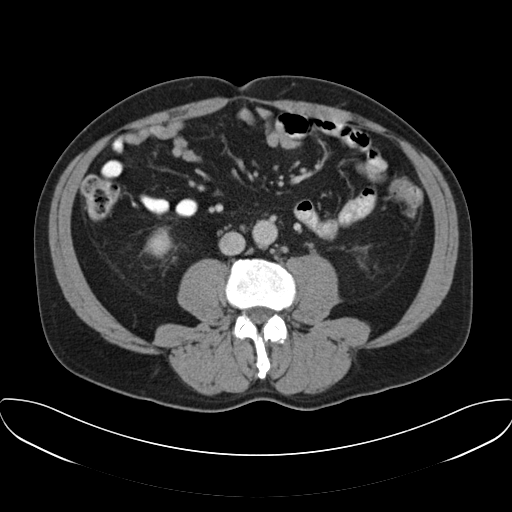
[im 62/108  soft-tissue]
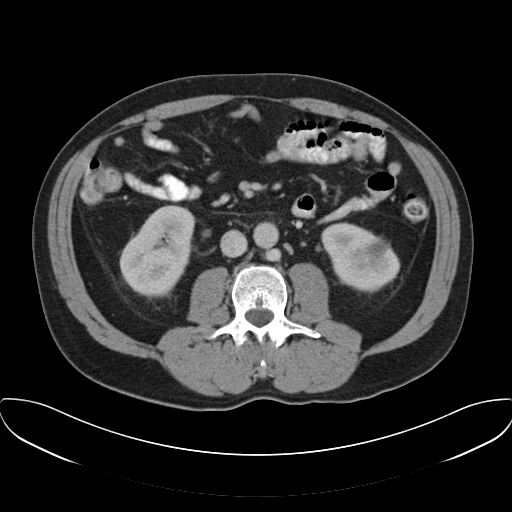
[im 74/108  soft-tissue]
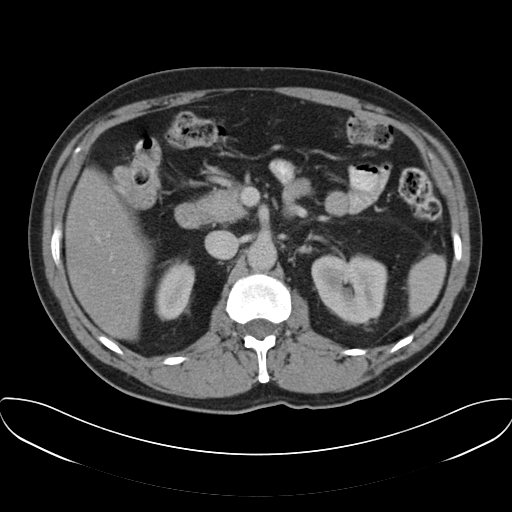
[im 79/108  soft-tissue]
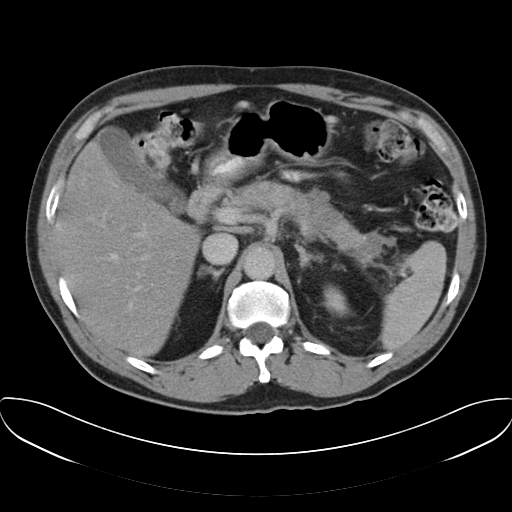
[im 79/108  bone]
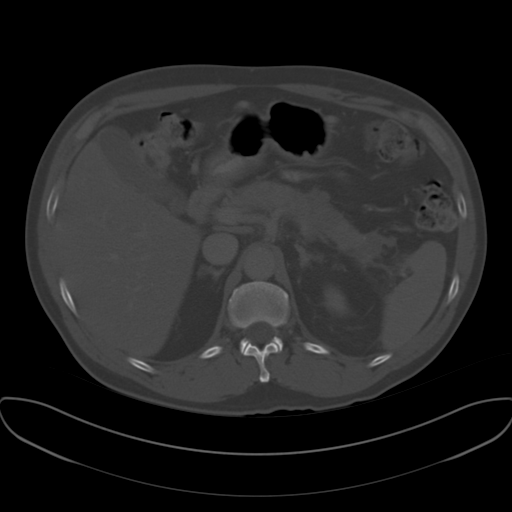
[im 91/108  soft-tissue]
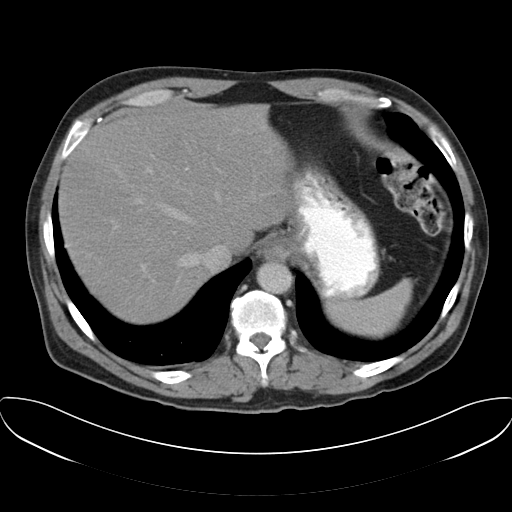
[im 102/108  soft-tissue]
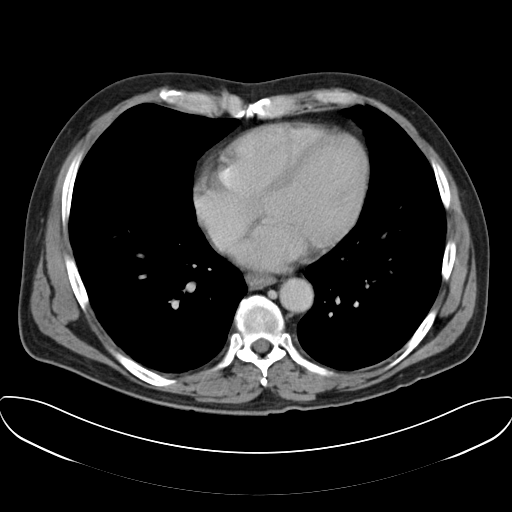

[Series 6: cor routine abd pel with · coronal · 0.72mm/px · 3 of 126 slices shown]
[im 42/126  soft-tissue]
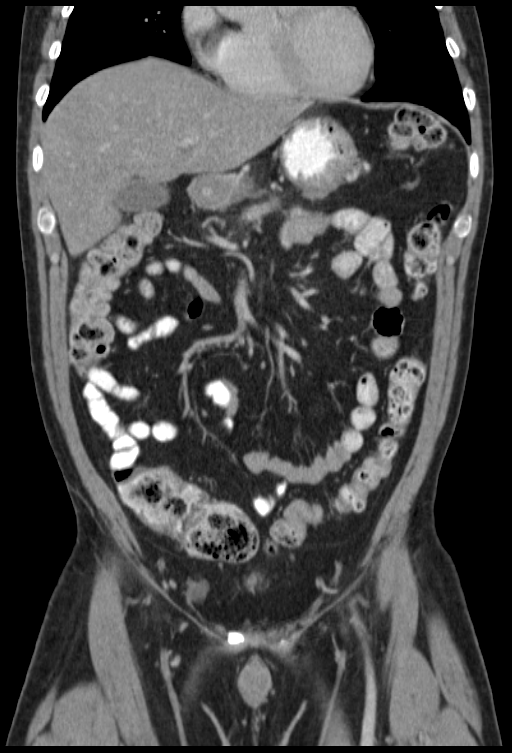
[im 56/126  soft-tissue]
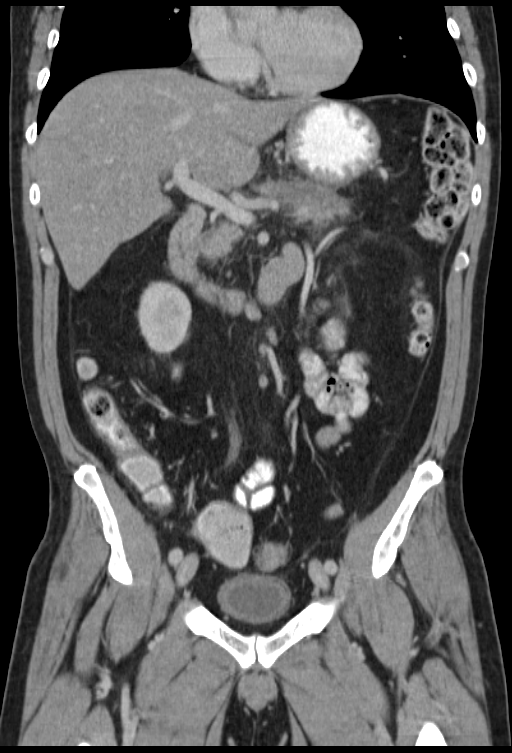
[im 70/126  soft-tissue]
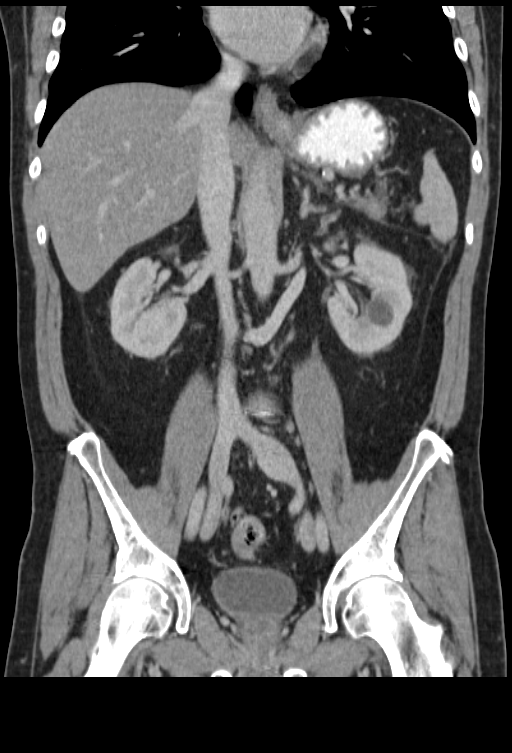

[14 of 46 positions shown; findings below may reference images not displayed]

FINDINGS: Subsegmental atelectasis seen dependently within the visualized lung
bases. No pleural or pericardial effusion.

Diffuse hypoattenuation of the liver is compatible with steatosis.
No focal intrahepatic lesions. Gallbladder within normal limits. No
biliary dilatation. Spleen is unremarkable. Calcification noted
within the right adrenal gland. Adrenal glands are otherwise
unremarkable.

Pancreas is somewhat ill-defined with scattered peripancreatic fat
stranding. Findings are suggestive of possible acute pancreatitis.
There is heterogeneous enhancement of the pancreatic parenchyma at
the level of the body and tail (series 2, image 30). Possible early
pancreatic necrosis not excluded. Small subcentimeter calcification
noted within the pancreatic tail. No peripancreatic fluid
collection. No collection seen elsewhere within the abdomen.

Kidneys are equal in size with symmetric enhancement. 2.5 cm cyst
present within the left kidney. 3 mm nonobstructive stone present
within the lower pole left kidney. No focal enhancing renal mass. No
hydronephrosis or hydroureter bilaterally.

Stomach within normal limits. No evidence for bowel obstruction.
Appendix is normal. There is mild circumferential wall thickening
with hazy fat stranding about the rectosigmoid region (series 2,
image 91). Findings are suggestive of proctocolitis. No evidence for
perforation. No associated abscess.

Mild circumferential bladder wall thickening noted, likely related
to incomplete distension. Prostate within normal limits.

Incidental note made of a 2 cm hypodense nodular density within the
anterior right pelvis, indeterminate, but may reflect a small
seroma. This finding is of doubtful clinical significance.

No free air or fluid. No pathologically enlarged intra-abdominal
pelvic lymph nodes. Normal intravascular enhancement seen within the
abdomen and pelvis.

No acute osseus abnormality. No worrisome lytic or blastic osseous
lesions.
IMPRESSION: 1. Ill definition of the pancreas with mild peripancreatic fat
stranding, suspicious for possible acute pancreatitis. Correlation
with serum lipase recommended. Differential enhancement within the
pancreatic body and tail may reflect patchy areas of involvement
versus early necrosis. No peripancreatic fluid collection.
2. Mild wall thickening with hazy fat stranding about the distal
colon/rectum, suggesting acute proctocolitis. No evidence for
perforation or other complication.
3. 3 mm nonobstructive left renal calculus.
4. Hepatic steatosis.
